# Patient Record
Sex: Female | Born: 1970 | Race: White | Hispanic: No | Marital: Married
Health system: Southern US, Community
[De-identification: ages and names within clinical notes are randomized; demographics above are authoritative.]

---

## 1999-06-21 ENCOUNTER — Other Ambulatory Visit: Admission: RE | Admit: 1999-06-21 | Discharge: 1999-06-21 | Payer: Self-pay | Admitting: Obstetrics & Gynecology

## 2000-07-02 ENCOUNTER — Other Ambulatory Visit: Admission: RE | Admit: 2000-07-02 | Discharge: 2000-07-02 | Payer: Self-pay | Admitting: Obstetrics & Gynecology

## 2001-08-24 ENCOUNTER — Other Ambulatory Visit: Admission: RE | Admit: 2001-08-24 | Discharge: 2001-08-24 | Payer: Self-pay | Admitting: Obstetrics & Gynecology

## 2002-08-25 ENCOUNTER — Other Ambulatory Visit: Admission: RE | Admit: 2002-08-25 | Discharge: 2002-08-25 | Payer: Self-pay | Admitting: Obstetrics & Gynecology

## 2003-08-29 ENCOUNTER — Other Ambulatory Visit: Admission: RE | Admit: 2003-08-29 | Discharge: 2003-08-29 | Payer: Self-pay | Admitting: Obstetrics & Gynecology

## 2004-10-03 ENCOUNTER — Other Ambulatory Visit: Admission: RE | Admit: 2004-10-03 | Discharge: 2004-10-03 | Payer: Self-pay | Admitting: Obstetrics & Gynecology

## 2005-12-18 ENCOUNTER — Other Ambulatory Visit: Admission: RE | Admit: 2005-12-18 | Discharge: 2005-12-18 | Payer: Self-pay | Admitting: Obstetrics & Gynecology

## 2006-01-28 ENCOUNTER — Encounter: Admission: RE | Admit: 2006-01-28 | Discharge: 2006-01-28 | Payer: Self-pay | Admitting: Obstetrics & Gynecology

## 2009-12-12 ENCOUNTER — Ambulatory Visit (HOSPITAL_COMMUNITY): Admission: RE | Admit: 2009-12-12 | Discharge: 2009-12-12 | Payer: Self-pay | Admitting: Family Medicine
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
## 2020-01-06 ENCOUNTER — Ambulatory Visit: Payer: Self-pay | Attending: Internal Medicine

## 2020-01-06 DIAGNOSIS — Z23 Encounter for immunization: Secondary | ICD-10-CM

## 2020-01-06 NOTE — Progress Notes (Signed)
   [facility]ov[facility] Vaccination [facility]  Name:  JHANVI DRAKEFORD    MRN: [id number] DOB: [birth year]  01/06/2020  Ms. Gessel was observed post Covid-19 immunization for 15 minutes without incident. She was provided with Vaccine Information Sheet and instruction to access the V-Safe system.   Ms. Slomski was instructed to call 911 with any severe reactions post vaccine: Marland Kitchen Difficulty breathing  . Swelling of face and throat  . A fast heartbeat  . A bad rash all over body  . Dizziness and weakness   Immunizations Administered    Name Date Dose VIS Date Route   Moderna COVID-19 Vaccine 01/06/2020 10:55 AM 0.5 mL 09/28/2019 Intramuscular   Manufacturer: Moderna   Lot: [id number]   NDC: 08022-336-12

## 2020-02-08 ENCOUNTER — Ambulatory Visit: Payer: Self-pay | Attending: Internal Medicine

## 2020-02-08 ENCOUNTER — Ambulatory Visit: Payer: Self-pay

## 2020-02-08 DIAGNOSIS — Z23 Encounter for immunization: Secondary | ICD-10-CM

## 2020-02-08 NOTE — Progress Notes (Signed)
   Covid-[facility] Vaccination [facility]  Name:  Judith Carlson    MRN: [id number] DOB: 1970-10-30  02/08/2020  Judith Carlson was observed post Covid-19 immunization for 15 minutes without incident. She was provided with Vaccine Information Sheet and instruction to access the V-Safe system.   Judith Carlson was instructed to call 911 with any severe reactions post vaccine: Marland Kitchen Difficulty breathing  . Swelling of face and throat  . A fast heartbeat  . A bad rash all over body  . Dizziness and weakness   Immunizations Administered    Name Date Dose VIS Date Route   Moderna COVID-19 Vaccine 02/08/2020  8:13 AM 0.5 mL 09/28/2019 Intramuscular   Manufacturer: Moderna   Lot: [id number]   NDC: 53794-327-61
